# Patient Record
Sex: Female | Born: 1940 | Race: White | Hispanic: No | Smoking: Never smoker
Health system: Southern US, Community
[De-identification: ages and names within clinical notes are randomized; demographics above are authoritative.]

## PROBLEM LIST (undated history)

## (undated) DIAGNOSIS — I10 Essential (primary) hypertension: Secondary | ICD-10-CM

## (undated) DIAGNOSIS — E119 Type 2 diabetes mellitus without complications: Secondary | ICD-10-CM

---

## 2004-06-14 ENCOUNTER — Emergency Department: Payer: Self-pay | Admitting: Emergency Medicine

## 2007-08-10 ENCOUNTER — Inpatient Hospital Stay: Payer: Self-pay | Admitting: Internal Medicine

## 2007-08-10 ENCOUNTER — Other Ambulatory Visit: Payer: Self-pay

## 2010-02-24 ENCOUNTER — Inpatient Hospital Stay: Payer: Self-pay | Admitting: Internal Medicine

## 2012-10-08 ENCOUNTER — Ambulatory Visit: Payer: Self-pay | Admitting: Nurse Practitioner

## 2012-11-07 ENCOUNTER — Ambulatory Visit: Payer: Self-pay | Admitting: Nurse Practitioner

## 2012-12-08 ENCOUNTER — Ambulatory Visit: Payer: Self-pay | Admitting: Nurse Practitioner

## 2013-04-07 ENCOUNTER — Ambulatory Visit: Payer: Self-pay | Admitting: Nurse Practitioner

## 2013-05-08 ENCOUNTER — Ambulatory Visit: Payer: Self-pay | Admitting: Nurse Practitioner

## 2013-07-08 ENCOUNTER — Ambulatory Visit
Admit: 2013-07-08 | Disposition: A | Discharge status: Home / Self Care | Payer: Self-pay | Attending: Nurse Practitioner | Admitting: Nurse Practitioner

## 2013-09-07 ENCOUNTER — Ambulatory Visit
Admit: 2013-09-07 | Disposition: A | Discharge status: Home / Self Care | Payer: Self-pay | Attending: Nurse Practitioner | Admitting: Nurse Practitioner

## 2014-10-21 ENCOUNTER — Emergency Department: Payer: Medicare Other

## 2014-10-21 ENCOUNTER — Emergency Department
Admission: EM | Admit: 2014-10-21 | Discharge: 2014-10-21 | Disposition: A | Discharge status: Home / Self Care | Payer: Medicare Other | Attending: Emergency Medicine | Admitting: Emergency Medicine

## 2014-10-21 ENCOUNTER — Encounter: Payer: Self-pay | Admitting: Emergency Medicine

## 2014-10-21 DIAGNOSIS — S8991XA Unspecified injury of right lower leg, initial encounter: Secondary | ICD-10-CM | POA: Diagnosis not present

## 2014-10-21 DIAGNOSIS — S60221A Contusion of right hand, initial encounter: Secondary | ICD-10-CM | POA: Insufficient documentation

## 2014-10-21 DIAGNOSIS — Y92410 Unspecified street and highway as the place of occurrence of the external cause: Secondary | ICD-10-CM | POA: Insufficient documentation

## 2014-10-21 DIAGNOSIS — Y9389 Activity, other specified: Secondary | ICD-10-CM | POA: Insufficient documentation

## 2014-10-21 DIAGNOSIS — E119 Type 2 diabetes mellitus without complications: Secondary | ICD-10-CM | POA: Diagnosis not present

## 2014-10-21 DIAGNOSIS — S60222A Contusion of left hand, initial encounter: Secondary | ICD-10-CM | POA: Diagnosis not present

## 2014-10-21 DIAGNOSIS — S39012A Strain of muscle, fascia and tendon of lower back, initial encounter: Secondary | ICD-10-CM | POA: Diagnosis not present

## 2014-10-21 DIAGNOSIS — T148XXA Other injury of unspecified body region, initial encounter: Secondary | ICD-10-CM

## 2014-10-21 DIAGNOSIS — S0003XA Contusion of scalp, initial encounter: Secondary | ICD-10-CM | POA: Insufficient documentation

## 2014-10-21 DIAGNOSIS — I10 Essential (primary) hypertension: Secondary | ICD-10-CM | POA: Diagnosis not present

## 2014-10-21 DIAGNOSIS — S0990XA Unspecified injury of head, initial encounter: Secondary | ICD-10-CM | POA: Diagnosis not present

## 2014-10-21 DIAGNOSIS — S50312A Abrasion of left elbow, initial encounter: Secondary | ICD-10-CM | POA: Insufficient documentation

## 2014-10-21 DIAGNOSIS — Y998 Other external cause status: Secondary | ICD-10-CM | POA: Insufficient documentation

## 2014-10-21 DIAGNOSIS — S8992XA Unspecified injury of left lower leg, initial encounter: Secondary | ICD-10-CM | POA: Insufficient documentation

## 2014-10-21 DIAGNOSIS — S3992XA Unspecified injury of lower back, initial encounter: Secondary | ICD-10-CM | POA: Diagnosis present

## 2014-10-21 HISTORY — DX: Type 2 diabetes mellitus without complications: E11.9

## 2014-10-21 HISTORY — DX: Essential (primary) hypertension: I10

## 2014-10-21 MED ORDER — OXYCODONE-ACETAMINOPHEN 5-325 MG PO TABS
2.0000 | ORAL_TABLET | Freq: Once | ORAL | Status: AC
  Administered 2014-10-21: 2 via ORAL
  Filled 2014-10-21: qty 2

## 2014-10-21 MED ORDER — OXYCODONE-ACETAMINOPHEN 5-325 MG PO TABS: 1.0000 | ORAL_TABLET | Freq: Four times a day (QID) | ORAL | Status: AC | PRN

## 2014-10-21 NOTE — Discharge Instructions (Signed)
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 upper part of your monitor and your hands to be slightly lower than your elbows. BODY POSITION  Your feet should make contact with the floor. If this is not possible, use a foot rest.  Keep your ears over your shoulders. This will reduce stress on your neck and low back. INCORRECT SITTING POSTURES  If you are feeling tired and unable to assume a healthy sitting posture, do not slouch or slump. This puts excessive strain on your back tissues, causing more damage and pain. Healthier options include:  Using more support, like a lumbar pillow.  Switching tasks to something that requires you to be upright or walking.  Talking a brief walk.  Lying down to rest in a neutral-spine position. PROLONGED STANDING WHILE SLIGHTLY LEANING FORWARD  When completing a task that requires you to lean forward while standing in one place for a long time, place either foot up on a stationary 2-4 inch high object to help maintain the best posture. When both feet are on the ground, the lower back tends to lose its slight inward  curve. If this curve flattens (or becomes too large), then the back and your other joints will experience too much stress, tire more quickly, and can cause pain. CORRECT STANDING POSTURES Proper standing posture should be assumed with all daily activities, even if they only take a few moments, like when brushing your teeth. As in sitting, your ears should fall over your shoulders and your shoulders should fall over your hips. You should keep a slight tension in your abdominal muscles to brace your spine. Your tailbone should point down to the ground, not behind your body, resulting in an over-extended swayback posture.  INCORRECT STANDING POSTURES  Common incorrect standing postures include a forward head, locked knees and/or an excessive swayback. WALKING Walk with an upright posture. Your ears, shoulders and hips should all line-up. PROLONGED ACTIVITY IN A FLEXED POSITION When completing a task that requires you to bend forward at your waist or lean over a low surface, try to find a way to stabilize 3 out of 4 of your limbs. You can place a hand or elbow on your thigh or rest a knee on the surface you are reaching across. This will provide you more stability, so that your muscles do not tire as quickly. By keeping your knees relaxed, or slightly bent, you will also reduce stress across your lower back. CORRECT LIFTING TECHNIQUES DO :  Assume a wide stance. This will provide you more stability and the opportunity to get as close as possible to the object which you are lifting.  Tense your abdominals to brace your spine. Bend at the knees and hips. Keeping your back locked in a neutral-spine position, lift using your leg muscles. Lift with your legs, keeping your back straight.  Test the weight of unknown objects before attempting to lift them.  Try to keep your elbows locked down at your sides in order get the best strength from your shoulders when carrying an object.  Always ask for help when  lifting heavy or awkward objects. INCORRECT LIFTING TECHNIQUES DO NOT:   Lock your knees when lifting, even if it is a small object.  Bend and twist. Pivot at your feet or move your feet when needing to change directions.  Assume that you can safely pick up even a paperclip without proper posture. Document Released: 01/24/2005 Document Revised: 04/18/2011 Document Reviewed: 05/08/2008 [facility] Patient Information 2015 [place], [place]. This information is not intended to replace advice given to you by your health care provider. Make sure you discuss any questions you have with your health care provider.
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
## 2015-11-23 DIAGNOSIS — F319 Bipolar disorder, unspecified: Secondary | ICD-10-CM | POA: Diagnosis not present

## 2015-11-23 DIAGNOSIS — E119 Type 2 diabetes mellitus without complications: Secondary | ICD-10-CM | POA: Diagnosis not present

## 2015-11-23 DIAGNOSIS — Z515 Encounter for palliative care: Secondary | ICD-10-CM | POA: Diagnosis not present

## 2015-11-23 DIAGNOSIS — Z66 Do not resuscitate: Secondary | ICD-10-CM | POA: Diagnosis not present

## 2018-04-30 ENCOUNTER — Non-Acute Institutional Stay: Payer: Medicare Other | Admitting: Nurse Practitioner

## 2018-04-30 ENCOUNTER — Encounter: Payer: Self-pay | Admitting: Nurse Practitioner

## 2018-04-30 VITALS — BP 132/70 | HR 70 | Temp 98.4°F | Resp 20 | Wt 261.7 lb

## 2018-04-30 DIAGNOSIS — Z515 Encounter for palliative care: Secondary | ICD-10-CM | POA: Insufficient documentation

## 2018-04-30 NOTE — Progress Notes (Signed)
Therapist, nutritional Palliative Care Consult Note Telephone: [phone]  Fax: [phone]  PATIENT NAME: Shannon Combs DOB: 1940-07-16 MRN: [id number]  PRIMARY CARE PROVIDER:  Dr Jamas Lav PROVIDER:  Dr [facility] RESPONSIBLE PARTY:  Doreen Salvage son [phone]   RECOMMENDATIONS and PLAN:  1. Palliative care encounter Z51.5; Palliative medicine team will continue to support patient, patient's family, and medical team. Visit consisted of counseling and education dealing with the complex and emotionally intense issues of symptom management and palliative care in the setting of serious and potentially life-threatening illness  Medical goals to include DNR, do not intubate, no feeding tube but wishes are for surgical procedures, lab testing, diagnostic testing, hospitalizations, blood transfusions, IV hydration, antibiotic therapy.  ASSESSMENT:     I visit and observed Ms. Shannon Combs. We talked about purpose for palliative care visit and consent obtained. We talked about how she is feeling today. She replies that she is doing fine. She denies symptoms of pain or shortness of breath. We talked about residing at [facility]illed [facility]. We talked about adl's. We talked about mobility. We talked about social isolation. We talked about importance of going to activities. We talked about her appetite and food choices. We talked about her blood sugars being more labile. She talked about challenges  of living and Skilled Nursing Facility. We talked about coping strategies. We talked about role of palliative care and plan of care. Therapeutic listening and emotional support provided. The present time she does remain stable. Will follow up in 3 months if needed or sooner should she declined. I updated nursing staff.  BMI 51.1 11 / 7 / 2019 weight 262.2 lbs 1 / 7 / 2020 weight 260.8 lbs 3 / 20 / 2020 weight 261.7 lbs  I spent 45 minutes  providing this consultation,  from 11:30am to 12:15pm. More than 50% of the time in this consultation was spent coordinating communication.   HISTORY OF PRESENT ILLNESS:  Shannon Combs is a 78 y.o. year old female with multiple medical problems including COPD, obstructive sleep apnea, chronic kidney disease, diabetes, hypertension, gerd, fibromyalgia, polycythemia vera with jak2 positive, Hepatitis B, arthritis, history of deep vein thrombosis, bilateral total knee Replacements, hysterectomy, appendectomy, tonsillectomy, depression, bipolar disorder. Shannon Combs continues to reside at skilled [facility]Term Care Nursing Facility. She is ambulatory in her room. She does perform adl's though requires prompting and encouragement. She toilets herself. She does feed herself and appetite has been Fair. She has been more compliant with food choices. Blood sugars have been relatively stable. No reason hospitalizations, falls. She's currently on an antibiotic for UTI. She is followed by Psychiatry at just thought he would last date of service 04/26/2018 for bipolar disorder, depression type, anxiety. She is prescribed Saphris for BPD, Effexor for mood and Buspar for anxiety no changes in medication at this visit. Last primary provider visit 3 / 11 / 2020 for chronic conditions with blood pressure labile though asymptomatic. Diabetes with labile blood sugars and A1C is at goal. She is able to verbalize her needs and often very cooperative. At present she is lying in bed. She appears obese but comfortable. No visitors present. Palliative Care was asked to help address goals of care.   CODE STATUS: DNR PPS: 50% HOSPICE ELIGIBILITY/DIAGNOSIS: TBD  PAST MEDICAL HISTORY:  Past Medical History:  Diagnosis Date  . Diabetes mellitus without complication (HCC)   . Hypertension     SOCIAL HX:  Social History  Tobacco Use  . Smoking status: Never Smoker  Substance Use Topics  . Alcohol use: No    ALLERGIES:   Allergies  Allergen Reactions  . Sulfa Antibiotics      PERTINENT MEDICATIONS:  Outpatient Encounter Medications as of 04/30/2018  Medication Sig  . oxyCODONE-acetaminophen (ROXICET) 5-325 MG per tablet Take 1 tablet by mouth every 6 (six) hours as needed.   No facility-administered encounter medications on file as of 04/30/2018.     PHYSICAL EXAM:   General: obese female, pleasant Cardiovascular: regular rate and rhythm Pulmonary: clear ant fields Abdomen: soft, nontender, + bowel sounds GU: no suprapubic tenderness Extremities: +BLE edema, no joint deformities Skin: no rashes Neurological: Weakness but otherwise nonfocal  Shannon Lipe Prince [place], NP

## 2018-05-01 ENCOUNTER — Other Ambulatory Visit: Payer: Self-pay

## 2018-09-12 ENCOUNTER — Non-Acute Institutional Stay: Payer: Medicare Other | Admitting: Nurse Practitioner

## 2018-09-12 ENCOUNTER — Other Ambulatory Visit: Payer: Self-pay

## 2018-09-12 ENCOUNTER — Encounter: Payer: Self-pay | Admitting: Nurse Practitioner

## 2018-09-12 VITALS — BP 126/60 | HR 86 | Temp 98.9°F | Resp 18 | Ht 70.0 in | Wt 258.3 lb

## 2018-09-12 DIAGNOSIS — Z515 Encounter for palliative care: Secondary | ICD-10-CM

## 2018-09-12 NOTE — Progress Notes (Signed)
[facility] Consult Note Telephone: [phone]  Fax: [phone]  PATIENT NAME: Shannon GentaCharlotte Combs DOB: May 01, 1940 MRN: [id number]  PRIMARY CARE PROVIDER:   Dr Kathleen LimeHodges REFERRING PROVIDER:  Dr Hodges/[facility] RESPONSIBLE PARTY:   Doreen SalvageDonald Saul son [phone]   RECOMMENDATIONS and PLAN:  1. ACP: Medical goals to include DNR, do not intubate, no feeding tube but wishes are for surgical procedures, lab testing, diagnostic testing, hospitalizations, blood transfusions, IV hydration, antibiotic therapy.  2. Palliative care encounter; Palliative medicine team will continue to support patient, patient's family, and medical team. Visit consisted of counseling and education dealing with the complex and emotionally intense issues of symptom management and palliative care in the setting of serious and potentially life-threatening illness  I spent 30 minutes providing this consultation,  from 8:30am to 9:00am. More than 50% of the time in this consultation was spent coordinating communication.   HISTORY OF PRESENT ILLNESS:  Shannon GentaCharlotte Combs is a 78 y.o. year old female with multiple medical problems including COPD, obstructive sleep apnea, chronic kidney disease, diabetes, hypertension, gerd, fibromyalgia, polycythemia vera with jak2 positive, Hepatitis B, arthritis, history of deep vein thrombosis, bilateral total knee Replacements, hysterectomy, appendectomy, tonsillectomy, depression, bipolar disorder. Palliative Care was asked to help to continue to address goals of care. [name] continues to reside at [facility] at [facility]. She is ambulatory with her walker and does get out of bed when she feels like it. Staff endorses  she has been spending more time sleeping with naps. She has had a fall 6 / 30 / 2020 with no apparent injury. She does require prompting and assistance with ADLs. She does  toilet herself. She feeds herself and appetite has been Fair. Blood sugar's continue to remain labile as she does go to the snack machine and get snacks. She does continue to participate in activities when brought to her as challenges with covid-19 pandemic no visitors and limited social interaction. No recent hospitalizations, wounds, infections. DNR does remain in place. She is followed by Podiatry and dental at the facility. She is followed by Psychiatry with last visit 7 / 9 / 2024 follow up with history of depression type anxiety with bipolar disorder. She is prescribed Effexor for mood, sap hris for BPD, Buspar for anxiety. No of justment to medications at that time she has been stable. Last primary provider visit 7 / 13 / 2020 for follow-up hyperlipidemia with ongoing stable on medical treatment. Medical goals consist of DNR, do not intubate, no feeding tube but wishes are for surgical intervention, lab testing, antibiotics, blood transfusion, diagnostic testing, hospitalization, IV hydration. Staff endorses no new changes to current goals are plan of care. At present she is lying in bed asleep. She did a wake to verbal cues. She appears comfortable. No visitors present; I visited and observed Shannon Combs. We talked about purpose of palliative care visit and she was in agreement. She was cooperative, made eye contact and interactive. We talked about symptoms of pain and shortness of breath what she denies. We talked about her functional level being ambulatory. We talked about her fall. We talked about her high risk of falling and caution with ambulation. We talked about residing at Skilled [facility]Term [facility] Nursing Facility. We talked about social isolation with covid. We talked about her getting her nails done which is something that has always been important to her. She shared that she's waiting for the activities  to come around to have that completed. We talked about things she can do in her room to keep her  occupied. We talked about her appetite and blood sugar's, food choices. She does continue to be stable at present time. Medical goals remain unchanged. Palliative care to offer emotional support. Will continue to follow a monitor with next visit in three months if needed or sooner should she decline as she continues to be stable. I updated nursing staff.  CODE STATUS: DNR  PPS: 50% HOSPICE ELIGIBILITY/DIAGNOSIS: TBD  PAST MEDICAL HISTORY:  Past Medical History:  Diagnosis Date  . Diabetes mellitus without complication ([place])   . Hypertension     SOCIAL HX:  Social History   Tobacco Use  . Smoking status: Never Smoker  Substance Use Topics  . Alcohol use: No    ALLERGIES:  Allergies  Allergen Reactions  . Sulfa Antibiotics      PERTINENT MEDICATIONS:  Outpatient Encounter Medications as of 09/12/2018  Medication Sig  . amLODipine (NORVASC) 5 MG tablet Take 5 mg by mouth daily.  . Asenapine Maleate (SAPHRIS) 10 MG SUBL Take 10 mg by mouth at bedtime.  Marland Kitchen aspirin 81 MG chewable tablet Chew by mouth daily.  Marland Kitchen atorvastatin (LIPITOR) 80 MG tablet Take 80 mg by mouth daily.  . busPIRone (BUSPAR) 7.5 MG tablet Take 7.5 mg by mouth daily.  . carvedilol (COREG) 12.5 MG tablet Take 12.5 mg by mouth 2 (two) times daily with a meal.  . docusate sodium (COLACE) 100 MG capsule Take 200 mg by mouth daily.  Marland Kitchen ezetimibe (ZETIA) 10 MG tablet Take 10 mg by mouth daily.  . fluticasone furoate-vilanterol (BREO ELLIPTA) 100-25 MCG/INH AEPB Inhale 1 puff into the lungs daily.  . hydrALAZINE (APRESOLINE) 50 MG tablet Take 50 mg by mouth 4 (four) times daily.  . hydroxyurea (HYDREA) 500 MG capsule Take 500 mg by mouth daily. May take with food to minimize GI side effects.  . insulin detemir (LEVEMIR) 100 UNIT/ML injection Inject 50 Units into the skin daily.  Marland Kitchen loperamide (IMODIUM) 2 MG capsule Take by mouth as needed for diarrhea or loose stools.  Marland Kitchen losartan (COZAAR) 100 MG tablet Take 100 mg by mouth  daily.  Marland Kitchen nystatin (NYSTATIN) powder Apply topically 4 (four) times daily.  Marland Kitchen omeprazole (PRILOSEC) 20 MG capsule Take 20 mg by mouth daily.  Marland Kitchen venlafaxine (EFFEXOR) 75 MG tablet Take 150 mg by mouth daily.  . Vitamin D, Ergocalciferol, (DRISDOL) 1.25 MG (50000 UT) CAPS capsule Take 50,000 Units by mouth every 30 (thirty) days.  Marland Kitchen oxyCODONE-acetaminophen (ROXICET) 5-325 MG per tablet Take 1 tablet by mouth every 6 (six) hours as needed. (Patient not taking: Reported on 09/12/2018)   No facility-administered encounter medications on file as of 09/12/2018.     PHYSICAL EXAM:   General: NAD, obese, pleasant female Cardiovascular: regular rate and rhythm Pulmonary: clear ant fields Abdomen: soft, nontender, + bowel sounds Extremities: + edema, no joint deformities Neurological: Weakness but otherwise nonfocal/walks with walker  Nasirah Sachs Ihor Gully, NP

## 2019-01-04 ENCOUNTER — Non-Acute Institutional Stay: Payer: Medicare Other | Admitting: Nurse Practitioner

## 2019-01-04 ENCOUNTER — Other Ambulatory Visit: Payer: Self-pay

## 2019-01-04 ENCOUNTER — Encounter: Payer: Self-pay | Admitting: Nurse Practitioner

## 2019-01-04 VITALS — BP 166/64 | HR 68 | Temp 97.5°F | Resp 18 | Wt 253.7 lb

## 2019-01-04 DIAGNOSIS — J449 Chronic obstructive pulmonary disease, unspecified: Secondary | ICD-10-CM

## 2019-01-04 DIAGNOSIS — Z515 Encounter for palliative care: Secondary | ICD-10-CM

## 2019-01-04 NOTE — Progress Notes (Signed)
[facility] Consult Note Telephone: [phone]  Fax: [phone]  PATIENT NAME: Anastazia Creek DOB: [birth year] MRN: [id number]  PRIMARY CARE PROVIDER:   Dr Yves Dill PROVIDER:  [facility] RESPONSIBLE PARTY:   Rosezella [place] son [phone]  RECOMMENDATIONS and PLAN: 1.ACP: Medical goals to include DNR, do not intubate, no feeding tube but wishes are for surgical procedures, lab testing, diagnostic testing, hospitalizations, blood transfusions, IV hydration, antibiotic therapy.  2. Palliative care encounter; Palliative medicine team will continue to support patient, patient's family, and medical team. Visit consisted of counseling and education dealing with the complex and emotionally intense issues of symptom management and palliative care in the setting of serious and potentially life-threatening illness  I spent 45 minutes providing this consultation,  from 10:45am to 11:30am. More than 50% of the time in this consultation was spent coordinating communication.   HISTORY OF PRESENT ILLNESS:  Sheyla Zaffino is a 78 y.o. year old female with multiple medical problems including COPD, obstructive sleep apnea, chronic kidney disease, diabetes, hypertension, gerd, fibromyalgia, polycythemia vera with jak2 positive, Hepatitis B, arthritis, history of deep vein thrombosis, bilateral total knee Replacements, hysterectomy, appendectomy, tonsillectomy, depression, bipolar disorder. Ms. Dearcos continues to reside at [place] at [facility]. She is able to transfer, perform adl's with encouragement, toilet herself. Ms. Shin does feed self in appetite has been good. Ms. Nordhoff has been covid positive. No recent hospitalizations, Falls, wounds. Ms. Jaymes Graff is able to verbalize her needs. At present Ms. Saul's is sleeping in her bed. She appears obese but comfortable. No visitors  present. I visited and observe Ms. Boutelle. Ms. Mcraney awoke to verbal cues. We talked about how she was feeling. Ms. Hickle verbalized that she was doing good. We talked about purpose of palliative care visit and she smiled and said thank you for visiting. We talked about symptoms of pain and shortness of breath what she denies. We talked a better appetite. We talked about recent covid positive infection and challenges with isolation. We talked about things that keep her occupied during the day including books or TV. Medical goals do continue to remain to treat what is treatable. We talked about role of palliative care and plan of care. Discussed in stable will follow up in two months if needed or sooner should she declined. Ms. Quizon in agreement. I updated nursing staff.  Palliative Care was asked to help to continue to address goals of care.   CODE STATUS: DNR  PPS: 50% HOSPICE ELIGIBILITY/DIAGNOSIS: TBD  PAST MEDICAL HISTORY:  Past Medical History:  Diagnosis Date   Diabetes mellitus without complication ([place])    Hypertension     SOCIAL HX:  Social History   Tobacco Use   Smoking status: Never Smoker  Substance Use Topics   Alcohol use: No    ALLERGIES:  Allergies  Allergen Reactions   Sulfa Antibiotics      PERTINENT MEDICATIONS:  Outpatient Encounter Medications as of 01/04/2019  Medication Sig   amLODipine (NORVASC) 5 MG tablet Take 5 mg by mouth daily.   Asenapine Maleate (SAPHRIS) 10 MG SUBL Take 10 mg by mouth at bedtime.   aspirin 81 MG chewable tablet Chew by mouth daily.   atorvastatin (LIPITOR) 80 MG tablet Take 80 mg by mouth daily.   busPIRone (BUSPAR) 7.5 MG tablet Take 7.5 mg by mouth daily.   carvedilol (COREG) 12.5 MG tablet Take 12.5 mg by mouth  2 (two) times daily with a meal.   docusate sodium (COLACE) 100 MG capsule Take 200 mg by mouth daily.   ezetimibe (ZETIA) 10 MG tablet Take 10 mg by mouth daily.   fluticasone furoate-vilanterol (BREO  ELLIPTA) 100-25 MCG/INH AEPB Inhale 1 puff into the lungs daily.   hydrALAZINE (APRESOLINE) 50 MG tablet Take 50 mg by mouth 4 (four) times daily.   hydroxyurea (HYDREA) 500 MG capsule Take 500 mg by mouth daily. May take with food to minimize GI side effects.   insulin detemir (LEVEMIR) 100 UNIT/ML injection Inject 50 Units into the skin daily.   loperamide (IMODIUM) 2 MG capsule Take by mouth as needed for diarrhea or loose stools.   losartan (COZAAR) 100 MG tablet Take 100 mg by mouth daily.   nystatin (NYSTATIN) powder Apply topically 4 (four) times daily.   omeprazole (PRILOSEC) 20 MG capsule Take 20 mg by mouth daily.   oxyCODONE-acetaminophen (ROXICET) 5-325 MG per tablet Take 1 tablet by mouth every 6 (six) hours as needed. (Patient not taking: Reported on 09/12/2018)   venlafaxine (EFFEXOR) 75 MG tablet Take 150 mg by mouth daily.   Vitamin D, Ergocalciferol, (DRISDOL) 1.25 MG (50000 UT) CAPS capsule Take 50,000 Units by mouth every 30 (thirty) days.   No facility-administered encounter medications on file as of 01/04/2019.     PHYSICAL EXAM:   General: NAD, pleasant female Cardiovascular: regular rate and rhythm Pulmonary: clear ant fields Abdomen: obese, soft, nontender, + bowel sounds Extremities: + edema, no joint deformities Neurological: Weakness but otherwise nonfocal  Briar Sword Prince [place], NP

## 2019-03-29 ENCOUNTER — Non-Acute Institutional Stay: Payer: Medicare Other | Admitting: Nurse Practitioner

## 2019-03-29 ENCOUNTER — Other Ambulatory Visit: Payer: Self-pay

## 2019-03-29 ENCOUNTER — Encounter: Payer: Self-pay | Admitting: Nurse Practitioner

## 2019-03-29 VITALS — BP 132/86 | HR 76 | Temp 98.0°F | Resp 18 | Wt 247.2 lb

## 2019-03-29 DIAGNOSIS — Z515 Encounter for palliative care: Secondary | ICD-10-CM

## 2019-03-29 DIAGNOSIS — J449 Chronic obstructive pulmonary disease, unspecified: Secondary | ICD-10-CM

## 2019-03-29 NOTE — Progress Notes (Signed)
[facility] Consult Note Telephone: [phone]  Fax: [phone]  PATIENT NAME: Shannon Combs DOB: 1940-06-03 MRN: [id number] PRIMARY CARE PROVIDER:Dr Yves Dill PROVIDER:Dr Hodges/[place] son [phone]  RECOMMENDATIONS and PLAN: 1.SVX:BLTJQZE goals to include DNR, do not intubate, no feeding tube but wishes are for surgical procedures, lab testing, diagnostic testing, hospitalizations, blood transfusions, IV hydration, antibiotic therapy.  2.Palliative care encounter; Palliative medicine team will continue to support patient, patient's family, and medical team. Visit consisted of counseling and education dealing with the complex and emotionally intense issues of symptom management and palliative care in the setting of serious and potentially life-threatening illness  I spent 40 minutes providing this consultation,  from 1:05pm to 1:40pm. More than 50% of the time in this consultation was spent coordinating communication.   HISTORY OF PRESENT ILLNESS:  Shannon Combs is a 79 y.o. year old female with multiple medical problems including COPD, obstructive sleep apnea, chronic kidney disease, diabetes, hypertension, gerd, fibromyalgia, polycythemia vera with jak2 positive, Hepatitis B, arthritis, history of deep vein thrombosis, bilateral total knee Replacements, hysterectomy, appendectomy, tonsillectomy, depression, bipolar disorder. [name] continues to reside at [place] at [facility] [name] does transfer to a wheelchair. [name] is able to perform some adl's but does require a lot of prompting and assisting by staff. [name] does toilet herself. [name] does feed herself and appetite has been good. Last primary provider note 2 / 16 / 2021 what's no changes at this visit. Medical goals to remain DNR, do not  intubate, do not hospitalize. [name] is followed by Psychiatry at the facility with last visit 2 / 11 / 2021 for bipolar disorder for which she takes sap hris in addition to Effexor for mood and Buspar for anxiety no medication adjustment at this visit. Followed by [facility] nurse practitioner with last visit 2 / 2 / 2021 for weakness and low blood pressure, held hydralazine. Previously positive for covid-19 symptomatic. Staff endorses no new changes or concerns. At present [name] is lying in bed asleep. [name] did awake to verbal cues. We talked about purpose of palliative care visit. [name] in agreement. We talked about how she's feeling today. [name] endorses she is tired. [name] denies any pain or shortness of breath. We talked about importance of being out of bed as she has been sleeping more. We talked about the challenges with limited social isolation due to covid pandemic. [name] endorses that it is harder for her to read from which she does enjoy. [name] endorses that she does like to sleep a lot. We talked about the importance of mobility. We talked about quality of life. We talked about medical goals. We talked about rolling palliative care and plan of care. [name] does continue to be stable though would like to see her more active, out of bed more. [name] endorses that she will try. Therapeutic listening and emotional support provided. Will follow up in two months if needed or sooner should she declined. [name] in agreement. I have updated nursing staff. No new changes to current goals or plan of care.Palliative Care was asked to help to continue to address goals of care.   CODE STATUS: DNR  PPS: 40% HOSPICE ELIGIBILITY/DIAGNOSIS: TBD  PAST MEDICAL HISTORY:  Past Medical History:  Diagnosis Date  . Diabetes mellitus without complication ([place])   . Hypertension  SOCIAL HX:  Social History   Tobacco Use  . Smoking status: Never Smoker  Substance Use Topics    . Alcohol use: No    ALLERGIES:  Allergies  Allergen Reactions  . Sulfa Antibiotics      PERTINENT MEDICATIONS:  Outpatient Encounter Medications as of 03/29/2019  Medication Sig  . amLODipine (NORVASC) 5 MG tablet Take 5 mg by mouth daily.  . Asenapine Maleate (SAPHRIS) 10 MG SUBL Take 10 mg by mouth at bedtime.  Marland Kitchen aspirin 81 MG chewable tablet Chew by mouth daily.  Marland Kitchen atorvastatin (LIPITOR) 80 MG tablet Take 80 mg by mouth daily.  . busPIRone (BUSPAR) 7.5 MG tablet Take 7.5 mg by mouth daily.  . carvedilol (COREG) 12.5 MG tablet Take 12.5 mg by mouth 2 (two) times daily with a meal.  . docusate sodium (COLACE) 100 MG capsule Take 200 mg by mouth daily.  Marland Kitchen ezetimibe (ZETIA) 10 MG tablet Take 10 mg by mouth daily.  . fluticasone furoate-vilanterol (BREO ELLIPTA) 100-25 MCG/INH AEPB Inhale 1 puff into the lungs daily.  . hydrALAZINE (APRESOLINE) 50 MG tablet Take 50 mg by mouth 4 (four) times daily.  . hydroxyurea (HYDREA) 500 MG capsule Take 500 mg by mouth daily. May take with food to minimize GI side effects.  . insulin detemir (LEVEMIR) 100 UNIT/ML injection Inject 50 Units into the skin daily.  Marland Kitchen loperamide (IMODIUM) 2 MG capsule Take by mouth as needed for diarrhea or loose stools.  Marland Kitchen losartan (COZAAR) 100 MG tablet Take 100 mg by mouth daily.  Marland Kitchen nystatin (NYSTATIN) powder Apply topically 4 (four) times daily.  Marland Kitchen omeprazole (PRILOSEC) 20 MG capsule Take 20 mg by mouth daily.  Marland Kitchen oxyCODONE-acetaminophen (ROXICET) 5-325 MG per tablet Take 1 tablet by mouth every 6 (six) hours as needed. (Patient not taking: Reported on 09/12/2018)  . venlafaxine (EFFEXOR) 75 MG tablet Take 150 mg by mouth daily.  . Vitamin D, Ergocalciferol, (DRISDOL) 1.25 MG (50000 UT) CAPS capsule Take 50,000 Units by mouth every 30 (thirty) days.   No facility-administered encounter medications on file as of 03/29/2019.    PHYSICAL EXAM:   General: NAD,obese, pleasant female Cardiovascular: regular rate and  rhythm Pulmonary: clear ant fields Abdomen: soft, nontender, + bowel sounds Extremities: no edema, no joint deformities Neurological: generalized weakness  [name] Zagami Prince [place], NP

## 2019-09-13 ENCOUNTER — Encounter: Payer: Self-pay | Admitting: Nurse Practitioner

## 2019-09-13 ENCOUNTER — Other Ambulatory Visit: Payer: Self-pay

## 2019-09-13 ENCOUNTER — Non-Acute Institutional Stay: Payer: Medicare Other | Admitting: Nurse Practitioner

## 2019-09-13 VITALS — BP 166/72 | Temp 97.4°F | Wt 242.0 lb

## 2019-09-13 DIAGNOSIS — J449 Chronic obstructive pulmonary disease, unspecified: Secondary | ICD-10-CM

## 2019-09-13 DIAGNOSIS — Z515 Encounter for palliative care: Secondary | ICD-10-CM

## 2019-09-13 NOTE — Progress Notes (Signed)
Therapist, nutritional Palliative Care Consult Note Telephone: [phone]  Fax: [phone]  PATIENT NAME: Shannon Combs DOB: 04-13-1940 MRN: [id number] PRIMARY CARE PROVIDER:Dr Kathleen Lime PROVIDER:Dr Hodges/[facility] RESPONSIBLE PARTY:Donald Jacqlyn Larsen son [phone]  RECOMMENDATIONS and PLAN: 1.YME:BRAXENM goals to include DNR, do not intubate, no feeding tube but wishes are for surgical procedures, lab testing, diagnostic testing, hospitalizations, blood transfusions, IV hydration, antibiotic therapy.  2.Palliative care encounter; Palliative medicine team will continue to support patient, patient's family, and medical team. Visit consisted of counseling and education dealing with the complex and emotionally intense issues of symptom management and palliative care in the setting of serious and potentially life-threatening illness  I spent 60 minutes providing this consultation,  Start at 11:45am. More than 50% of the time in this consultation was spent coordinating communication.   HISTORY OF PRESENT ILLNESS:  Shannon Combs is a 79 y.o. year old female with multiple medical problems including COPD, obstructive sleep apnea, chronic kidney disease, diabetes, hypertension, gerd, fibromyalgia, polycythemia vera with jak2 positive, Hepatitis B, arthritis, history of deep vein thrombosis, bilateral total knee Replacements, hysterectomy, appendectomy, tonsillectomy, depression, bipolar disorder.  Shannon Combs continues to reside at [facility] Shannon Combs does require assistance with transfer, mobility. Shannon Combs will set up in a wheelchair when transferred with staff. Shannon Combs is ADL dependent including toileting. Shannon Combs does feed herself with their appetite, weight 242 with BMI 47.3. Shannon Combs blood sugar's continue to be in the 200 range currently on insulin therapy. Shannon Combs  did have an episode of hypoglycemia blood sugar 51 cold and clammy on 09/12/2019. Shannon Combs does remain on a diabetic diet regular texture regular liquid consistency. Shannon Combs does have difficulty with snacking and diet non-compliance at times. Shannon Combs has had recent fall 08/20/2019 with skin tears and bruising hematoma to the right side of her face which has since healed. No recent hospitalizations, wounds, infections. Shannon Combs is followed by Psychiatry at the facility with last date of service 08/15/2019 for bipolar disorder currently on Saphris for what she has been on for some time and any reduction in regiment is likely to risk decompensation, not recommended with the benefits outweighing the rest. Shannon Combs is also on Buspar for anxiety which has been effective and no adjustment. Last primary provider visit [facility] Nurse Practitioner 08/21/2019 for comprehensive review and fall. Medical goals to focus on DNR, do not intubate, do not hospitalize, no feeding tube. Wishes are for antibiotics, blood transfusion, diagnostic testing, IV hydration, lab testing, surgical intervention. Staff endorses no new changes our concerns. At present Shannon Combs is lying in bed, she appears comfortable. No visitors present. I visited and observed Shannon Combs. We talked about purpose of palliative care visit. Shannon Combs in agreement. We talked about how Shannon Combs has been feeling. Shannon Combs endorses that she has been doing Combs better. Shannon Combs recently was moved to a new room that she likes very Combs. We talked about facility opening up more with visitors which has been helpful for missiles. Ms. Szuch endorses she has been attending activities and is excited to go. We talked about different activities that were listed on the calendar as today it is an off day. We talked about the challenges residing at a facility. We talked about her functional level. Strongly encouraged mobility, getting out of the room. Ms. Dancer was in agreement. We  talked about her nutrition, my blood  sugar is in diet. We talked about snacks. We talked about things that she likes to do during the day. Ms. Carvey had her shoes lined up about 10 pairs and a row by her bed. We talked about the shoes that were neatly placed in order. We talked about symptoms of pain and shortness of breath what she denies. We talked about medical goals of care, limited and reviewed. We talked about role of palliative care and plan of care. Attempted to contact her family, unable to leave a message for review of medical goals of care update on palliative care visit. No new changes recommended at present time. I have updated nursing staff. Will do follow-up visit in 3 months if needed or sooner should she decline. Palliative Care was asked to help to continue to address goals of care.   CODE STATUS: DNR  PPS: 50% HOSPICE ELIGIBILITY/DIAGNOSIS: TBD  PAST MEDICAL HISTORY:  Past Medical History:  Diagnosis Date  . Diabetes mellitus without complication ([facility])   . Hypertension     SOCIAL HX:  Social History   Tobacco Use  . Smoking status: Never Smoker  Substance Use Topics  . Alcohol use: No    ALLERGIES:  Allergies  Allergen Reactions  . Sulfa Antibiotics      PERTINENT MEDICATIONS:  Outpatient Encounter Medications as of 09/13/2019  Medication Sig  . amLODipine (NORVASC) 5 MG tablet Take 5 mg by mouth daily.  . Asenapine Maleate (SAPHRIS) 10 MG SUBL Take 10 mg by mouth at bedtime.  Marland Kitchen aspirin 81 MG chewable tablet Chew by mouth daily.  Marland Kitchen atorvastatin (LIPITOR) 80 MG tablet Take 80 mg by mouth daily.  . busPIRone (BUSPAR) 7.5 MG tablet Take 7.5 mg by mouth daily.  . carvedilol (COREG) 12.5 MG tablet Take 12.5 mg by mouth 2 (two) times daily with a meal.  . docusate sodium (COLACE) 100 MG capsule Take 200 mg by mouth daily.  Marland Kitchen ezetimibe (ZETIA) 10 MG tablet Take 10 mg by mouth daily.  . fluticasone furoate-vilanterol (BREO ELLIPTA) 100-25 MCG/INH AEPB Inhale 1 puff into  the lungs daily.  . hydrALAZINE (APRESOLINE) 50 MG tablet Take 50 mg by mouth 4 (four) times daily.  . hydroxyurea (HYDREA) 500 MG capsule Take 500 mg by mouth daily. May take with food to minimize GI side effects.  . insulin detemir (LEVEMIR) 100 UNIT/ML injection Inject 50 Units into the skin daily.  Marland Kitchen loperamide (IMODIUM) 2 MG capsule Take by mouth as needed for diarrhea or loose stools.  Marland Kitchen losartan (COZAAR) 100 MG tablet Take 100 mg by mouth daily.  Marland Kitchen nystatin (NYSTATIN) powder Apply topically 4 (four) times daily.  Marland Kitchen omeprazole (PRILOSEC) 20 MG capsule Take 20 mg by mouth daily.  Marland Kitchen oxyCODONE-acetaminophen (ROXICET) 5-325 MG per tablet Take 1 tablet by mouth every 6 (six) hours as needed. (Patient not taking: Reported on 09/12/2018)  . venlafaxine (EFFEXOR) 75 MG tablet Take 150 mg by mouth daily.  . Vitamin D, Ergocalciferol, (DRISDOL) 1.25 MG (50000 UT) CAPS capsule Take 50,000 Units by mouth every 30 (thirty) days.   No facility-administered encounter medications on file as of 09/13/2019.    PHYSICAL EXAM:   General: NAD, obese, pleasant female Cardiovascular: regular rate and rhythm Pulmonary: clear ant fields Neurological: w/c dependent  Gwenna Fuston Prince Rome, NP

## 2019-11-22 ENCOUNTER — Other Ambulatory Visit: Payer: Self-pay

## 2019-11-22 ENCOUNTER — Encounter: Payer: Self-pay | Admitting: Nurse Practitioner

## 2019-11-22 ENCOUNTER — Non-Acute Institutional Stay: Payer: Medicare Other | Admitting: Nurse Practitioner

## 2019-11-22 DIAGNOSIS — J449 Chronic obstructive pulmonary disease, unspecified: Secondary | ICD-10-CM

## 2019-11-22 DIAGNOSIS — Z515 Encounter for palliative care: Secondary | ICD-10-CM

## 2019-11-22 NOTE — Progress Notes (Signed)
Therapist, nutritional Palliative Care Consult Note Telephone: [phone]  Fax: [phone]  PATIENT NAME: Shannon Combs DOB: [birth year] MRN: [id number] PRIMARY CARE PROVIDER:Dr Kathleen Lime PROVIDER:Dr Hodges/[facility] RESPONSIBLE PARTY:Donald Jacqlyn Larsen son [phone]  RECOMMENDATIONS and PLAN: 1.HWT:UUEKCMK goals to include DNR, do not intubate, no feeding tube but wishes are for surgical procedures, lab testing, diagnostic testing, hospitalizations, blood transfusions, IV hydration, antibiotic therapy.  2.Palliative care encounter; Palliative medicine team will continue to support patient, patient's family, and medical team. Visit consisted of counseling and education dealing with the complex and emotionally intense issues of symptom management and palliative care in the setting of serious and potentially life-threatening illness  3. F/u 3 months or sooner if declines for ongoing monitoring disease progression, supportive role  I spent 60 minutes providing this consultation,  Starting at 11:00am. More than 50% of the time in this consultation was spent coordinating communication.   HISTORY OF PRESENT ILLNESS:  Shannon Combs is a 79 y.o. year old female with multiple medical problems including COPD, obstructive sleep apnea, chronic kidney disease, diabetes, hypertension, gerd, fibromyalgia, polycythemia vera with jak2 positive, Hepatitis B, arthritis, history of deep vein thrombosis, bilateral total knee Replacements, hysterectomy, appendectomy, tonsillectomy, depression, bipolar disorder. [name] continues to reside at [facility]. [name] does require assistance for transfer but is able to sit up in the wheelchair. [name] continues to go out on Sundays to her family's home. [name] does require assistance with ADLs, bathing, dressing, toileting with prompting. Ms.  Quijas does feed herself and appetite remains good. No recent falls, wounds, infections, hospitalizations. Staff endorses no new changes our concerns. Last Primary Provider note from [facility] Nurse Practitioner in 10 / 4 / 2021 for diabetes with A1C goal less than 9. No signs or symptoms of hypo or hyperglycemia. Medical goals focus on DNR, do not intubate, do not hospitalized, no feeding tube but wishes are for antibiotics, blood transfusion, IV fluids, diagnostic testing, lab testing, surgical interventions. Treat aggressively if recovery is likely but reassess often and transition to comfort care if recovery is unlikely. She is followed by Psychiatry at the facility was last date of service 9 / 2 / 2021 for bipolar disorder for what she takes Saphris for and Effexor for mood. She also takes Buspar for anxiety with no medication changes at this visit. At present [name] is lying in bed. [name] looks appears comfortable, no visitors present. I visited and observed [name]. We talked about purpose of palliative care visit. [name] in agreement. We talked about how she was feeling. [name] endorses she is having a good day. We talked about somethings of pain and shortness of breath what she denies. We talked about her appetite which has been good. [name] endorses she has been better with compliance with her food choices, snacks. We talked about the plants that she has growing in her windowsill. We talked about quality of life. Medical goals review. [name] was cooperative with this estimate, smiling without very interactive. [name] talked about her Sunday visits with her family. Therapeutic listening, emotional support provided. At present [name] does remain stable. No further changes recommended at present time to goals of care. Most of palliative visits supportive. Updated nursing staff. We'll follow up with palliative care visit in 2 months if needed or sooner should she declined.   Palliative  Care was asked to help to  continue to address goals of care.   CODE STATUS: DNR  PPS: 50% HOSPICE ELIGIBILITY/DIAGNOSIS: TBD  PAST MEDICAL HISTORY:  Past Medical History:  Diagnosis Date  . Diabetes mellitus without complication (HCC)   . Hypertension     SOCIAL HX:  Social History   Tobacco Use  . Smoking status: Never Smoker  Substance Use Topics  . Alcohol use: No    ALLERGIES:  Allergies  Allergen Reactions  . Sulfa Antibiotics      PERTINENT MEDICATIONS:  Outpatient Encounter Medications as of 11/22/2019  Medication Sig  . amLODipine (NORVASC) 5 MG tablet Take 5 mg by mouth daily.  . Asenapine Maleate (SAPHRIS) 10 MG SUBL Take 10 mg by mouth at bedtime.  Marland Kitchen aspirin 81 MG chewable tablet Chew by mouth daily.  Marland Kitchen atorvastatin (LIPITOR) 80 MG tablet Take 80 mg by mouth daily.  . busPIRone (BUSPAR) 7.5 MG tablet Take 7.5 mg by mouth daily.  . carvedilol (COREG) 12.5 MG tablet Take 12.5 mg by mouth 2 (two) times daily with a meal.  . docusate sodium (COLACE) 100 MG capsule Take 200 mg by mouth daily.  Marland Kitchen ezetimibe (ZETIA) 10 MG tablet Take 10 mg by mouth daily.  . fluticasone furoate-vilanterol (BREO ELLIPTA) 100-25 MCG/INH AEPB Inhale 1 puff into the lungs daily.  . hydrALAZINE (APRESOLINE) 50 MG tablet Take 50 mg by mouth 4 (four) times daily.  . hydroxyurea (HYDREA) 500 MG capsule Take 500 mg by mouth daily. May take with food to minimize GI side effects.  . insulin detemir (LEVEMIR) 100 UNIT/ML injection Inject 50 Units into the skin daily.  Marland Kitchen loperamide (IMODIUM) 2 MG capsule Take by mouth as needed for diarrhea or loose stools.  Marland Kitchen losartan (COZAAR) 100 MG tablet Take 100 mg by mouth daily.  Marland Kitchen nystatin (NYSTATIN) powder Apply topically 4 (four) times daily.  Marland Kitchen omeprazole (PRILOSEC) 20 MG capsule Take 20 mg by mouth daily.  Marland Kitchen oxyCODONE-acetaminophen (ROXICET) 5-325 MG per tablet Take 1 tablet by mouth every 6 (six) hours as needed. (Patient not taking: Reported on  09/12/2018)  . venlafaxine (EFFEXOR) 75 MG tablet Take 150 mg by mouth daily.  . Vitamin D, Ergocalciferol, (DRISDOL) 1.25 MG (50000 UT) CAPS capsule Take 50,000 Units by mouth every 30 (thirty) days.   No facility-administered encounter medications on file as of 11/22/2019.    PHYSICAL EXAM:   General: NAD, obese, pleasant female Cardiovascular: regular rate and rhythm Pulmonary: clear ant fields Neurological: generalized weakness w/c dependent  Naureen Benton Prince Rome, NP

## 2020-02-26 ENCOUNTER — Encounter: Payer: Self-pay | Admitting: Nurse Practitioner

## 2020-02-26 ENCOUNTER — Other Ambulatory Visit: Payer: Self-pay

## 2020-02-26 ENCOUNTER — Non-Acute Institutional Stay: Payer: Medicare Other | Admitting: Nurse Practitioner

## 2020-02-26 VITALS — BP 147/61 | HR 68 | Temp 98.0°F | Resp 18 | Wt 239.0 lb

## 2020-02-26 DIAGNOSIS — J449 Chronic obstructive pulmonary disease, unspecified: Secondary | ICD-10-CM

## 2020-02-26 DIAGNOSIS — Z515 Encounter for palliative care: Secondary | ICD-10-CM

## 2020-02-26 NOTE — Progress Notes (Signed)
Therapist, nutritional Palliative Care Consult Note Telephone: [phone]  Fax: [phone]  PATIENT NAME: Shannon Combs DOB: [birth year] MRN: [id number]  PRIMARY CARE PROVIDER:  [facility] RESPONSIBLE PARTY:Donald Jacqlyn Larsen son [phone]  RECOMMENDATIONS and PLAN: 1.XMI:WOEHOZY goals to include DNR, do not intubate, no feeding tube but wishes are for surgical procedures, lab testing, diagnostic testing, hospitalizations, blood transfusions, IV hydration, antibiotic therapy.  2.Palliative care encounter; Palliative medicine team will continue to support patient, patient's family, and medical team. Visit consisted of counseling and education dealing with the complex and emotionally intense issues of symptom management and palliative care in the setting of serious and potentially life-threatening illness  3. F/u 3 months or sooner if declines for ongoing monitoring disease progression, supportive role I spent 40 minutes providing this consultation, starting at 12;30pm. More than 50% of the time in this consultation was spent coordinating communication.   HISTORY OF PRESENT ILLNESS:  Shannon Combs is a 80 y.o. year old female with multiple medical problems including COPD, obstructive sleep apnea, chronic kidney disease, diabetes, hypertension, gerd, fibromyalgia, polycythemia vera with jak2 positive, Hepatitis B, arthritis, history of deep vein thrombosis, bilateral total knee Replacements, hysterectomy, appendectomy, tonsillectomy, depression, bipolar disorder. [name] continues to reside in [facility]. [name] does ambulate in the room but when she ventures out of her room is typically in a wheelchair. [name] does require assistance for bathing and dressing. [name] toilets herself. [name] feed herself. Current weight 239 point one with a 3 lb weight gain, BMI 46.7. [name]  currently on a diabetic diet regular texture regular liquid consistency. Blood sugars have been ranging from 74 to 258 currently on insulin therapy. No recent falls, wounds, hospitalizations, infections. Last [facility] Primary Provider note 12 / 10 / 2021 for hyperlipidemia. Medical goals focus on DNR in place, do not intubate, do not hospitalized. Wishes are for antibiotics, blood transfusion, diagnostic testing, IV hydration, lab testing, surgical intervention. [name] followed by Psychiatry at the facility with last day to serve is 37 / 44 / 2021 for bipolar for what she has prescribed Saphris and Effexor for me. Anxiety for what she has prescribed Buspar. No changes at this visit. Staff endorses no new changes their concerns. At present [name] was lying in bed. [name] appears comfortable. No visitors present. I visited and observed [name]. We talked about purpose of Palliative care visit. [name] in agreement. We talked about how she is feeling today. [name] endorses she is doing okay. [name] endorses just a little sleepy. We talked about activities, her daily routine. We talked about importance of socialization. We talked about something the pain and shortness of breath what she did not realize. We talked about her appetite which is good. We talked about he's that she likes and importance of blood sugars being compliant with diet. We talked about residing at facility. [name] endorses she does like her new room when she has been in for some time now. [name] does remain stable. We talked about the things [name] likes to do which is watch TV. We talked about frequent napping and sleeping at night, sleep hygiene. We talked about role of Palliative care and plan of care. Will follow up in 3 months if needed or sooner should she decline. I have updated nursing staff and any changes to current goals or plan of care. Most of  Palliative care visit supportive. [name] was cooperative with  assessment. I have attempted to contact [name] son for update on PC visit  Palliative Care was asked to help to continue to address goals of care.   CODE STATUS: DNR  PPS: 50% HOSPICE ELIGIBILITY/DIAGNOSIS: TBD  PAST MEDICAL HISTORY:  Past Medical History:  Diagnosis Date  . Diabetes mellitus without complication (HCC)   . Hypertension     SOCIAL HX:  Social History   Tobacco Use  . Smoking status: Never Smoker  . Smokeless tobacco: Not on file  Substance Use Topics  . Alcohol use: No    ALLERGIES:  Allergies  Allergen Reactions  . Sulfa Antibiotics        PHYSICAL EXAM:   General: NAD, pleasant female Cardiovascular: regular rate and rhythm Pulmonary: clear ant fields Extremities: +BLE edema, no joint deformities Neurological: ambulatory  Christin Prince Rome, NP

## 2020-06-03 ENCOUNTER — Encounter: Payer: Self-pay | Admitting: Nurse Practitioner

## 2020-06-03 ENCOUNTER — Other Ambulatory Visit: Payer: Self-pay

## 2020-06-03 ENCOUNTER — Non-Acute Institutional Stay: Payer: Medicare Other | Admitting: Nurse Practitioner

## 2020-06-03 VITALS — BP 148/78 | HR 78 | Temp 98.1°F | Resp 18 | Wt 243.6 lb

## 2020-06-03 DIAGNOSIS — Z515 Encounter for palliative care: Secondary | ICD-10-CM

## 2020-06-03 DIAGNOSIS — R0602 Shortness of breath: Secondary | ICD-10-CM

## 2020-06-03 NOTE — Progress Notes (Signed)
[facility] Consult Note Telephone: [phone]  Fax: [phone]    Date of encounter: 06/03/20 PATIENT NAME: Shannon Combs [address] [place] [address]   [phone] (home)  DOB: March 16, 1940 MRN: [id number] PRIMARY CARE PROVIDER:   Dr [facility] RESPONSIBLE PARTY:    Contact Information    Name Relation Home Work [place]  [phone]      I met face to face with patient in facility. Palliative Care was asked to follow this patient by consultation request of  No ref. provider found to address advance care planning and complex medical decision making. This is a follow up visit.  ASSESSMENT AND PLAN / RECOMMENDATIONS:   Advance Care Planning/Goals of Care: Goals include to maximize quality of life and symptom management. Our advance care planning conversation included a discussion about:     The value and importance of advance care planning   Experiences with loved ones who have been seriously ill or have died   Exploration of personal, cultural or spiritual beliefs that might influence medical decisions   Exploration of goals of care in the event of a sudden injury or illness   Identification and preparation of a healthcare agent   Review and updating or creation of an  advance directive document .  Decision not to resuscitate or to de-escalate disease focused treatments due to poor prognosis.  CODE STATUS: DNR  Symptom Management/Plan: 1.KJZ:PHXTAVW goals to include DNR, do not intubate, no feeding tube but wishes are for surgical procedures, lab testing, diagnostic testing, hospitalizations, blood transfusions, IV hydration, antibiotic therapy.  2.Palliative care encounter; Palliative medicine team will continue to support patient, patient's family, and medical team. Visit consisted of counseling and education dealing with the complex and emotionally intense  issues of symptom management and palliative care in the setting of serious and potentially life-threatening illness  3. Shortness of breath secondary to COPD, stable, continue inhalation therapy, monitor respiratory status  Follow up Palliative Care Visit: Palliative care will continue to follow for complex medical decision making, advance care planning, and clarification of goals. Return 12 weeks or prn.  I spent 45 minutes providing this consultation starting at 1:00pm. More than 50% of the time in this consultation was spent in counseling and care coordination.  PPS: 50%  HOSPICE ELIGIBILITY/DIAGNOSIS: TBD  Chief Complaint: Palliative consult for follow-up complex medical decision making  HISTORY OF PRESENT ILLNESS:  Shannon Combs is a 80 y.o. year old female  with COPD, obstructive sleep apnea, chronic kidney disease, diabetes, hypertension, gerd, fibromyalgia, polycythemia vera with jak2 positive, Hepatitis B, arthritis, history of deep vein thrombosis, bilateral total knee Replacements, hysterectomy, appendectomy, tonsillectomy, depression, bipolar disorder. Shannon Combs continues to reside in [place] [facility]. Shannon Combs is ambulatory with walker. Shannon Combs does require assistance with bathing, dressing. Shannon Combs toilets herself. Shannon Combs feeds herself. Current weight 243.6 lbs with BMI 47.6. 04/24/2020 lost balance complained of back pain. 04/03/2020 seen by [facility] NP for gerd, continue omeprazone. Followed by psychiatry with last visit 05/14/2020 for bipolar prescribed saphris with effexor for mood. Generalized anxiety prescribed buspar. Insomnia prescribed melatonin. Staff endorses Shannon Combs continues to go to her son's home on Sunday as well as restarted going back to get her nails done. Staff endorses no concerns per staff. At present, Shannon Combs is sitting in bed, appears comfortable. No visitors present. I visited and observed Shannon Combs. We  talked about purpose of PC visit. Ms. Reindel in agreement. We talked about symptoms, currently Shannon Combs denies pain or shortness of breath. We talked about appetite, nutrition, the food choices and snacks. We talked about weight. We talked about Shannon Combs functional ability, ambulatory with walker. We talked about Shannon Combs walking in the hall. Praised Shannon Combs for going in the hall, increasing socialization. Ms. Fratus endorses she has been sitting upfront at the facility. We talked about importance of mobility, exercise, socialization. We talked about Shannon Combs going to her son home on Sunday. We talked about her enjoyment with her outings. We talked about her residing at [facility]. We talked about her family. We talked about quality of life. Medical goals reviewed. Shannon Combs spirits, attitude and very interactive this visit. We talked about role PC in poc. Most of visit supportive. Therapeutic listening, emotional support provided. I updated nursing staff, no new changes to poc.   History obtained from review of EMR, discussion  and interview with family, facility staff and Shannon Combs. I reviewed available labs, medications, imaging, studies and related documents from the EMR.  Records reviewed and summarized above.   ROS Full 14 system review of systems performed and negative with exception of: as per HPI.  Physical Exam: Constitutional: NAD General: pleasant, obese female EYES: lids intact ENMT: oral mucous membranes moist CV: S1S2, RRR, +mild LE edema Pulmonary: LCTA, no increased work of breathing, no cough, room air Abdomen: intake 100%, normo-active BS + 4 quadrants, soft and non tender GU: deferred MSK: moves all extremities, ambulatory Skin: warm and dry, no rashes or wounds on visible skin Neuro:  no generalized weakness,  mild cognitive impairment Psych: flat affect, A and O x 3  Questions and concerns were addressed. The patient/staffwas encouraged to call with questions and/or concerns.   Provided general support and encouragement, no other unmet needs identified  Thank you for the opportunity to participate in the care of Shannon Combs.  The palliative care team will continue to follow. Please call our office at [phone] if we can be of additional assistance.   This chart was dictated using voice recognition software. Despite best efforts to proofread, errors can occur which can change the documentation meaning.  Bless Belshe Ihor Gully, NP , DNP, MSN,  [facility]

## 2020-09-22 ENCOUNTER — Emergency Department
Admission: EM | Admit: 2020-09-22 | Discharge: 2020-09-23 | Disposition: A | Discharge status: Skilled Nursing Facility | Payer: Medicare Other | Attending: Emergency Medicine | Admitting: Emergency Medicine

## 2020-09-22 ENCOUNTER — Emergency Department: Payer: Medicare Other

## 2020-09-22 ENCOUNTER — Other Ambulatory Visit: Payer: Self-pay

## 2020-09-22 DIAGNOSIS — B9689 Other specified bacterial agents as the cause of diseases classified elsewhere: Secondary | ICD-10-CM | POA: Insufficient documentation

## 2020-09-22 DIAGNOSIS — Z7951 Long term (current) use of inhaled steroids: Secondary | ICD-10-CM | POA: Diagnosis not present

## 2020-09-22 DIAGNOSIS — I10 Essential (primary) hypertension: Secondary | ICD-10-CM | POA: Diagnosis not present

## 2020-09-22 DIAGNOSIS — Z7982 Long term (current) use of aspirin: Secondary | ICD-10-CM | POA: Insufficient documentation

## 2020-09-22 DIAGNOSIS — E119 Type 2 diabetes mellitus without complications: Secondary | ICD-10-CM | POA: Diagnosis not present

## 2020-09-22 DIAGNOSIS — F0391 Unspecified dementia with behavioral disturbance: Secondary | ICD-10-CM | POA: Diagnosis not present

## 2020-09-22 DIAGNOSIS — Z79899 Other long term (current) drug therapy: Secondary | ICD-10-CM | POA: Diagnosis not present

## 2020-09-22 DIAGNOSIS — Z794 Long term (current) use of insulin: Secondary | ICD-10-CM | POA: Insufficient documentation

## 2020-09-22 DIAGNOSIS — N3000 Acute cystitis without hematuria: Secondary | ICD-10-CM | POA: Insufficient documentation

## 2020-09-22 DIAGNOSIS — R456 Violent behavior: Secondary | ICD-10-CM | POA: Diagnosis present

## 2020-09-22 DIAGNOSIS — F03918 Unspecified dementia, unspecified severity, with other behavioral disturbance: Secondary | ICD-10-CM

## 2020-09-22 LAB — URINALYSIS, COMPLETE (UACMP) WITH MICROSCOPIC
Bilirubin Urine: NEGATIVE
Glucose, UA: 150 mg/dL — AB
Hgb urine dipstick: NEGATIVE
Ketones, ur: NEGATIVE mg/dL
Nitrite: NEGATIVE
Protein, ur: >=300 mg/dL — AB
Specific Gravity, Urine: 1.02 (ref 1.005–1.030)
WBC, UA: >50 WBC/hpf — ABNORMAL HIGH (ref 0–5)
pH: 6 (ref 5.0–8.0)

## 2020-09-22 MED ORDER — CEFDINIR 300 MG PO CAPS: 300.0000 mg | ORAL_CAPSULE | Freq: Two times a day (BID) | ORAL | 0 refills | Status: AC
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
## 2020-12-11 ENCOUNTER — Non-Acute Institutional Stay: Payer: Medicare Other | Admitting: Nurse Practitioner

## 2020-12-11 ENCOUNTER — Encounter: Payer: Self-pay | Admitting: Nurse Practitioner

## 2020-12-11 VITALS — BP 130/80 | HR 78 | Temp 98.0°F | Resp 18 | Wt 238.7 lb
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
## 2021-03-05 ENCOUNTER — Non-Acute Institutional Stay: Payer: Commercial Managed Care - HMO | Admitting: Nurse Practitioner

## 2021-03-05 ENCOUNTER — Encounter: Payer: Self-pay | Admitting: Nurse Practitioner

## 2021-03-05 VITALS — BP 138/72 | HR 88 | Temp 97.1°F | Resp 18 | Wt 234.7 lb

## 2021-03-05 DIAGNOSIS — R0602 Shortness of breath: Secondary | ICD-10-CM

## 2021-03-05 DIAGNOSIS — J449 Chronic obstructive pulmonary disease, unspecified: Secondary | ICD-10-CM

## 2021-03-05 DIAGNOSIS — Z515 Encounter for palliative care: Secondary | ICD-10-CM

## 2021-03-05 NOTE — Progress Notes (Addendum)
Therapist, [facility] Palliative Care Consult Note Telephone: [phone]  Fax: [phone]    Date of encounter: 03/05/21 7:28 PM PATIENT NAME: Shannon Combs [address] [place] [address]   [phone] (home)  DOB: 1940-05-13 MRN: [id number] PRIMARY CARE PROVIDER:    Aviva Kluver,  [facility]  RESPONSIBLE PARTY:    Contact Information     Name Relation Home Work Mobile   Shannon Combs  [phone]       I met face to face with patient in facility. Palliative Care was asked to follow this patient by consultation request of  [facility] to address advance care planning and complex medical decision making. This is a follow up visit.                                ASSESSMENT AND PLAN / RECOMMENDATIONS: Symptom Management/Plan: 1. ACP: Medical goals to include DNR, do not intubate, no feeding tube but wishes are for surgical procedures, lab testing, diagnostic testing, hospitalizations, blood transfusions, IV hydration, antibiotic therapy.   2. Palliative care encounter; Palliative medicine team will continue to support patient, patient's family, and medical team. Visit consisted of counseling and education dealing with the complex and emotionally intense issues of symptom management and palliative care in the setting of serious and potentially life-threatening illness   3. Shortness of breath secondary to COPD, stable, continue inhalation therapy, monitor respiratory status   Follow up Palliative Care Visit: Palliative care will continue to follow for complex medical decision making, advance care planning, and clarification of goals. Return 12 weeks or prn.  I spent 68 minutes providing this consultation. More than 50% of the time in this consultation was spent in counseling and care coordination.  PPS: 50%  Chief Complaint: Follow up palliative consult for complex medical decision making   HISTORY OF  PRESENT ILLNESS:  Shannon Combs is a 81 y.o. year old female with multiple medical problems including  COPD, obstructive sleep apnea, chronic kidney disease, diabetes, hypertension, gerd, fibromyalgia, polycythemia vera with jak2 positive, Hepatitis B, arthritis, history of deep vein thrombosis, bilateral total knee Replacements, hysterectomy, appendectomy, tonsillectomy, depression, bipolar disorder. Ms. Shannon Combs continues to reside in [facility]. Ms. Shannon Combs is ambulatory with walker. Ms. Shannon Combs does require assistance with bathing, dressing. Ms. Shannon Combs toilets herself. Ms. Shannon Combs feeds herself with good appetite. No recent falls, wounds, hospitalizations, infections. Ms Shannon Combs has not had any recent exacerbations. I visited and observed Ms. Shannon Combs. We talked about PC visit, symptoms, ros, residing at LTC, quality of life, her daily routine, what brings her joy, going to her son's home on the weekends. We talked about appetite, nutrition, healthy snacks, educated. We talked about importance of mobility, fall risk. We talked about role pc in poc. I attempted to contact son for update on PC visit. No new changes recommended today, updated staff.   History obtained from review of EMR, discussion with facility staff and  Ms. Shannon Combs.  I reviewed available labs, medications, imaging, studies and related documents from the EMR.  Records reviewed and summarized above.   ROS 10 point system reviewed with staff and Ms. Shannon Combs all negative except HPI  Physical Exam: Constitutional: NAD General: obese, pleasant female EYES: lids intact ENMT: oral mucous membranes moist CV: S1S2, RRR Pulmonary: LCTA, no increased work of breathing, no cough, room air Abdomen: intake 100%, normo-active  BS + 4 quadrants, soft and non tender MSK:  ambulatory Skin: warm and dry Neuro:  no generalized weakness,  + cognitive impairment Psych: non-anxious affect, A and O x 3 Thank  you for the opportunity to participate in the care of Ms. Shannon Combs.  The palliative care team will continue to follow. Please call our office at [phone] if we can be of additional assistance.   Questions and concerns were addressed. Provided general support and encouragement, no other unmet needs identified   This chart was dictated using voice recognition software.  Despite best efforts to proofread,  errors can occur which can change the documentation meaning.  Kathrynne Kulinski Ihor Gully, NP

## 2021-03-08 ENCOUNTER — Other Ambulatory Visit: Payer: Self-pay

## 2021-04-07 DEATH — deceased

## 2022-01-13 IMAGING — CT CT HEAD W/O CM
3 series · 15 of 47 positions shown, 18 images · non-contrast
Comparison: 10/21/2014

CLINICAL DATA: Altered mental status

EXAM:
CT HEAD WITHOUT CONTRAST
TECHNIQUE: Contiguous axial images were obtained from the base of the skull
through the vertex without intravenous contrast.

[Series 2: head wo · axial · 0.40mm/px · z∈[-152,-27]mm · 9 of 30 slices shown, 12 images]
[im 3/30  brain]
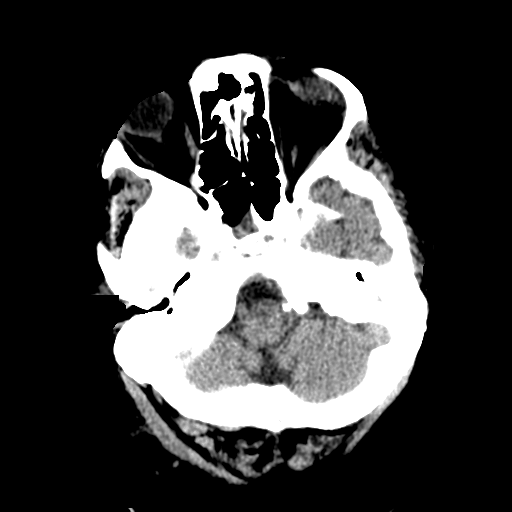
[im 3/30  bone]
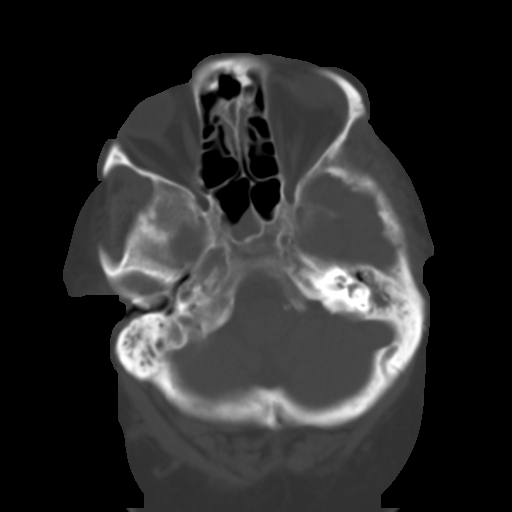
[im 6/30  brain]
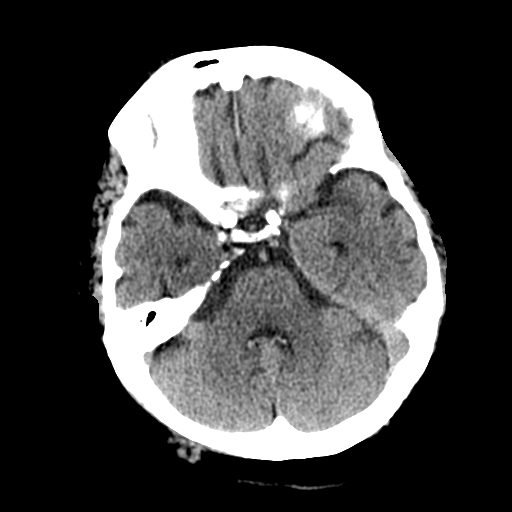
[im 9/30  brain]
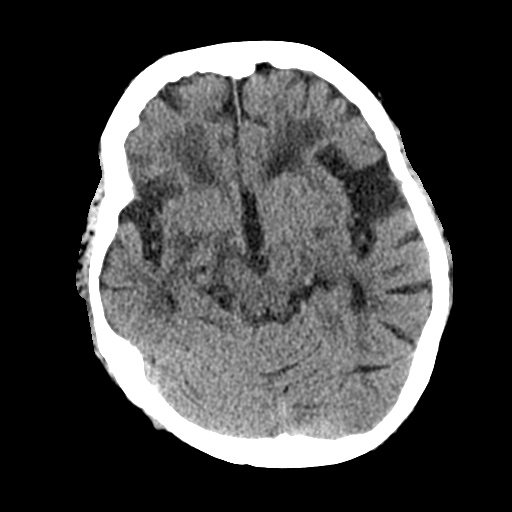
[im 12/30  brain]
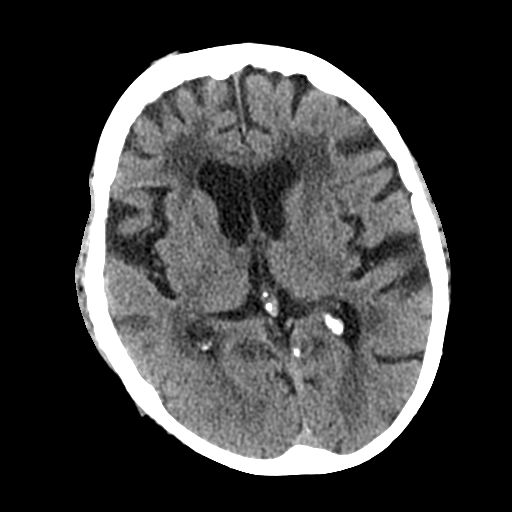
[im 16/30  brain]
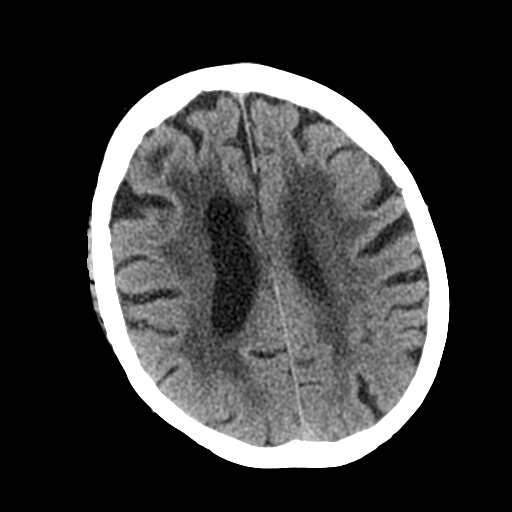
[im 16/30  bone]
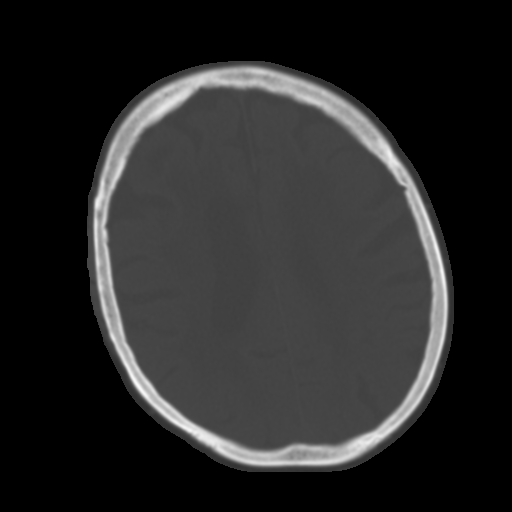
[im 19/30  brain]
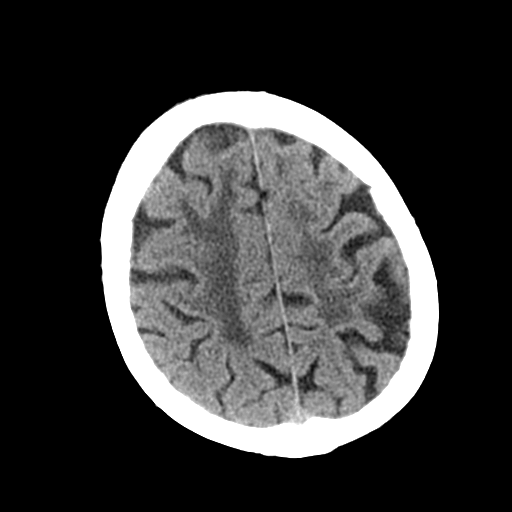
[im 22/30  brain]
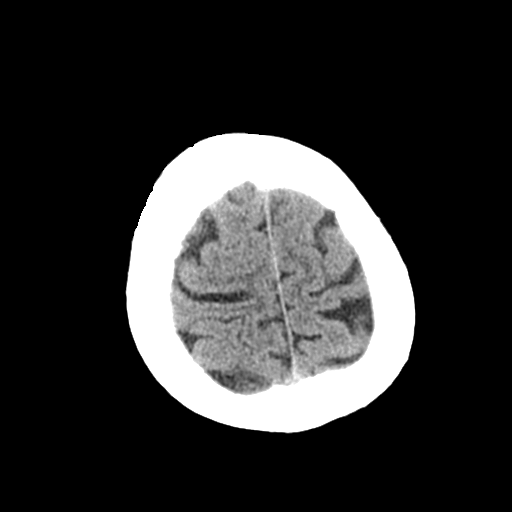
[im 25/30  brain]
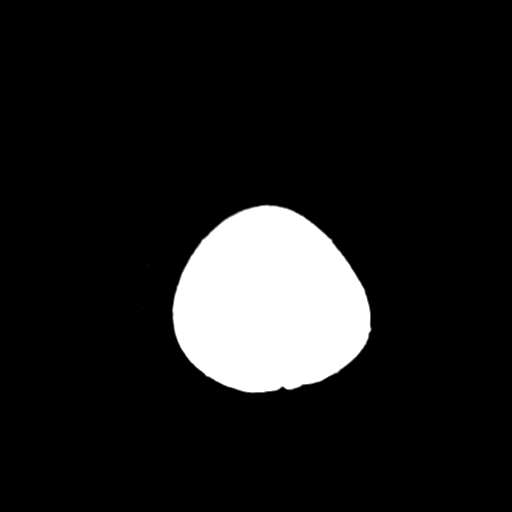
[im 28/30  brain]
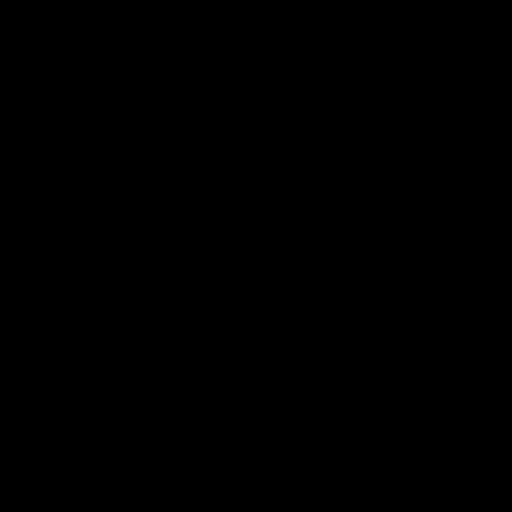
[im 28/30  bone]
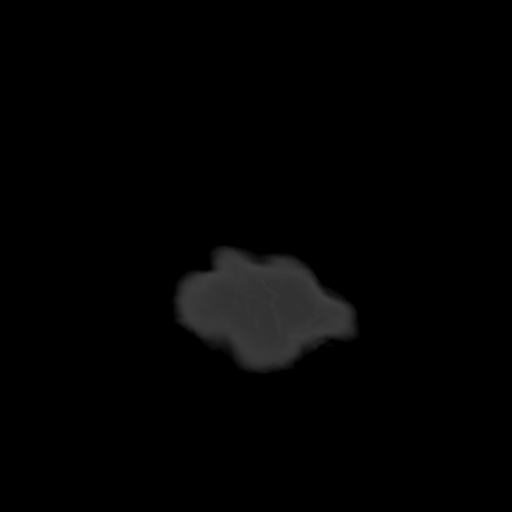

[Series 4: sagittal soft tissue · sagittal · 0.31mm/px · 3 of 57 slices shown]
[im 19/57  brain]
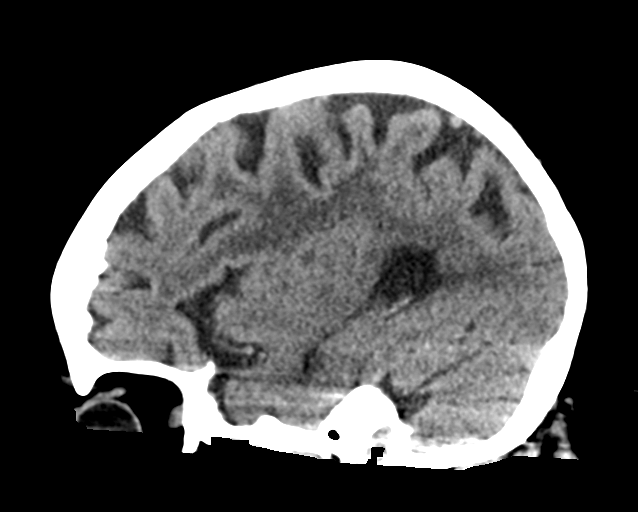
[im 29/57  brain]
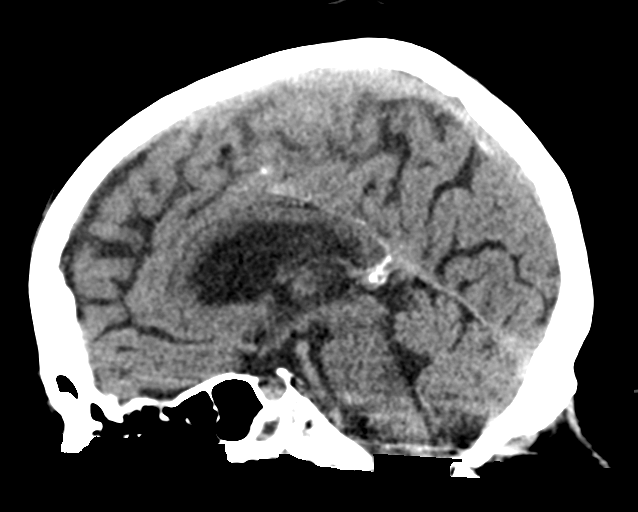
[im 38/57  brain]
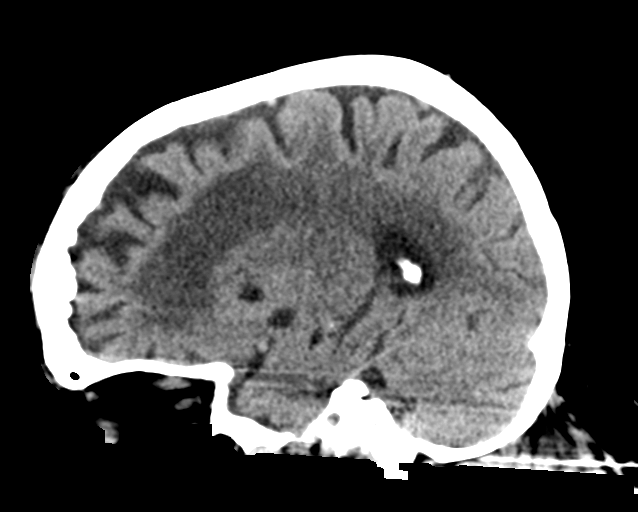

[Series 5: coronal soft tissue · coronal · 0.31mm/px · 3 of 61 slices shown]
[im 21/61  brain]
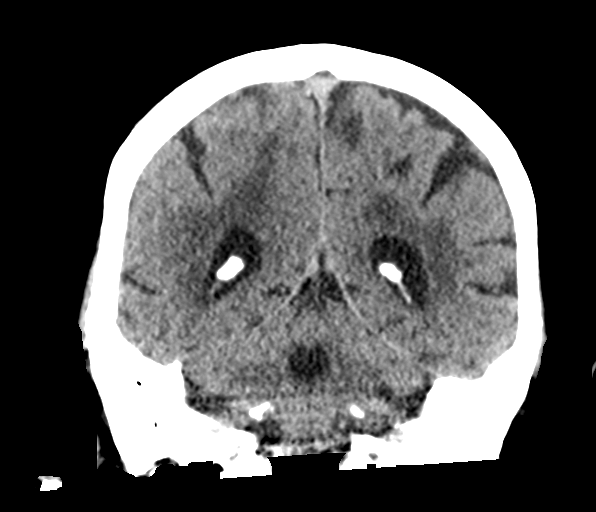
[im 27/61  brain]
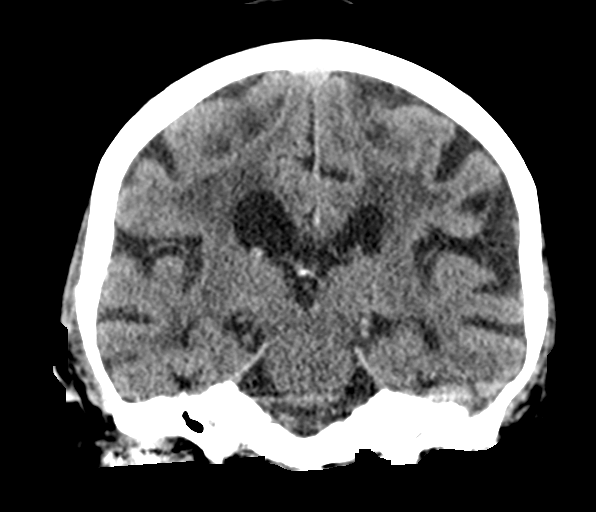
[im 34/61  brain]
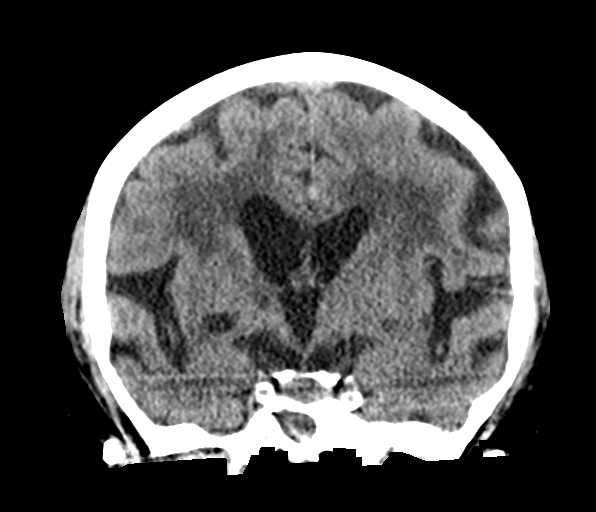

[15 of 47 positions shown; findings below may reference images not displayed]

FINDINGS: Brain: No evidence of acute infarction, hemorrhage, hydrocephalus,
extra-axial collection or mass lesion/mass effect. Chronic atrophic
and ischemic changes are noted. Lacunar infarct is noted within the
right basal ganglia stable from the prior exam. Smaller lacunar
infarct is noted in the left basal ganglia.

Vascular: No hyperdense vessel or unexpected calcification.

Skull: Normal. Negative for fracture or focal lesion.

Sinuses/Orbits: No acute finding.

Other: None.
IMPRESSION: Chronic atrophic and ischemic changes stable from the prior exam.
